# Patient Record
Sex: Male | Born: 1960 | Race: White | Hispanic: No | Marital: Married | Smoking: Current every day smoker
Health system: Southern US, Community
[De-identification: ages and names within clinical notes are randomized; demographics above are authoritative.]

## PROBLEM LIST (undated history)

## (undated) DIAGNOSIS — I10 Essential (primary) hypertension: Secondary | ICD-10-CM

## (undated) DIAGNOSIS — I2510 Atherosclerotic heart disease of native coronary artery without angina pectoris: Secondary | ICD-10-CM

## (undated) DIAGNOSIS — I4891 Unspecified atrial fibrillation: Secondary | ICD-10-CM

## (undated) HISTORY — PX: KNEE SURGERY: SHX244

## (undated) HISTORY — PX: LEFT HEART CATH: CATH118248

---

## 2018-11-13 ENCOUNTER — Observation Stay (HOSPITAL_COMMUNITY)
Admission: EM | Admit: 2018-11-13 | Discharge: 2018-11-14 | Discharge status: Against Medical Advice | Payer: Self-pay | Attending: Internal Medicine | Admitting: Internal Medicine

## 2018-11-13 ENCOUNTER — Encounter (HOSPITAL_COMMUNITY): Payer: Self-pay | Admitting: Internal Medicine

## 2018-11-13 ENCOUNTER — Emergency Department (HOSPITAL_COMMUNITY): Payer: Self-pay

## 2018-11-13 ENCOUNTER — Other Ambulatory Visit: Payer: Self-pay

## 2018-11-13 DIAGNOSIS — R401 Stupor: Secondary | ICD-10-CM

## 2018-11-13 DIAGNOSIS — T50901A Poisoning by unspecified drugs, medicaments and biological substances, accidental (unintentional), initial encounter: Secondary | ICD-10-CM

## 2018-11-13 DIAGNOSIS — T5191XA Toxic effect of unspecified alcohol, accidental (unintentional), initial encounter: Secondary | ICD-10-CM | POA: Insufficient documentation

## 2018-11-13 DIAGNOSIS — Z1159 Encounter for screening for other viral diseases: Secondary | ICD-10-CM | POA: Insufficient documentation

## 2018-11-13 DIAGNOSIS — I2510 Atherosclerotic heart disease of native coronary artery without angina pectoris: Secondary | ICD-10-CM | POA: Insufficient documentation

## 2018-11-13 DIAGNOSIS — Y906 Blood alcohol level of 120-199 mg/100 ml: Secondary | ICD-10-CM | POA: Insufficient documentation

## 2018-11-13 DIAGNOSIS — T424X1A Poisoning by benzodiazepines, accidental (unintentional), initial encounter: Principal | ICD-10-CM | POA: Insufficient documentation

## 2018-11-13 DIAGNOSIS — F329 Major depressive disorder, single episode, unspecified: Secondary | ICD-10-CM | POA: Insufficient documentation

## 2018-11-13 DIAGNOSIS — Z7901 Long term (current) use of anticoagulants: Secondary | ICD-10-CM | POA: Insufficient documentation

## 2018-11-13 DIAGNOSIS — I480 Paroxysmal atrial fibrillation: Secondary | ICD-10-CM

## 2018-11-13 DIAGNOSIS — I4891 Unspecified atrial fibrillation: Secondary | ICD-10-CM | POA: Diagnosis present

## 2018-11-13 DIAGNOSIS — F17210 Nicotine dependence, cigarettes, uncomplicated: Secondary | ICD-10-CM | POA: Insufficient documentation

## 2018-11-13 DIAGNOSIS — I10 Essential (primary) hypertension: Secondary | ICD-10-CM | POA: Diagnosis present

## 2018-11-13 DIAGNOSIS — T40604A Poisoning by unspecified narcotics, undetermined, initial encounter: Secondary | ICD-10-CM | POA: Diagnosis present

## 2018-11-13 HISTORY — DX: Atherosclerotic heart disease of native coronary artery without angina pectoris: I25.10

## 2018-11-13 HISTORY — DX: Unspecified atrial fibrillation: I48.91

## 2018-11-13 HISTORY — DX: Essential (primary) hypertension: I10

## 2018-11-13 LAB — PROTIME-INR
INR: 0.9 (ref 0.8–1.2)
Prothrombin Time: 12.5 seconds (ref 11.4–15.2)

## 2018-11-13 LAB — COMPREHENSIVE METABOLIC PANEL
ALT: 14 U/L (ref 0–44)
AST: 25 U/L (ref 15–41)
Albumin: 3.5 g/dL (ref 3.5–5.0)
Alkaline Phosphatase: 48 U/L (ref 38–126)
Anion gap: 10 (ref 5–15)
BUN: 16 mg/dL (ref 6–20)
CO2: 26 mmol/L (ref 22–32)
Calcium: 8.8 mg/dL — ABNORMAL LOW (ref 8.9–10.3)
Chloride: 104 mmol/L (ref 98–111)
Creatinine, Ser: 0.85 mg/dL (ref 0.61–1.24)
GFR calc Af Amer: >60 mL/min (ref >60)
GFR calc non Af Amer: >60 mL/min (ref >60)
Glucose, Bld: 96 mg/dL (ref 70–99)
Potassium: 3.2 mmol/L — ABNORMAL LOW (ref 3.5–5.1)
Sodium: 140 mmol/L (ref 135–145)
Total Bilirubin: 0.6 mg/dL (ref 0.3–1.2)
Total Protein: 6.2 g/dL — ABNORMAL LOW (ref 6.5–8.1)

## 2018-11-13 LAB — CBC WITH DIFFERENTIAL/PLATELET
Abs Immature Granulocytes: 0.03 10*3/uL (ref 0.00–0.07)
Basophils Absolute: 0.1 10*3/uL (ref 0.0–0.1)
Basophils Relative: 1 %
Eosinophils Absolute: 0.3 10*3/uL (ref 0.0–0.5)
Eosinophils Relative: 4 %
HCT: 39.6 % (ref 39.0–52.0)
Hemoglobin: 13.2 g/dL (ref 13.0–17.0)
Immature Granulocytes: 1 %
Lymphocytes Relative: 31 %
Lymphs Abs: 2 10*3/uL (ref 0.7–4.0)
MCH: 32.4 pg (ref 26.0–34.0)
MCHC: 33.3 g/dL (ref 30.0–36.0)
MCV: 97.3 fL (ref 80.0–100.0)
Monocytes Absolute: 0.7 10*3/uL (ref 0.1–1.0)
Monocytes Relative: 11 %
Neutro Abs: 3.4 10*3/uL (ref 1.7–7.7)
Neutrophils Relative %: 52 %
Platelets: 241 10*3/uL (ref 150–400)
RBC: 4.07 MIL/uL — ABNORMAL LOW (ref 4.22–5.81)
RDW: 13.2 % (ref 11.5–15.5)
WBC: 6.5 10*3/uL (ref 4.0–10.5)
nRBC: 0 % (ref 0.0–0.2)

## 2018-11-13 LAB — RAPID URINE DRUG SCREEN, HOSP PERFORMED
Amphetamines: NOT DETECTED
Barbiturates: NOT DETECTED
Benzodiazepines: POSITIVE — AB
Cocaine: NOT DETECTED
Opiates: NOT DETECTED
Tetrahydrocannabinol: NOT DETECTED

## 2018-11-13 LAB — SALICYLATE LEVEL: Salicylate Lvl: <7 mg/dL (ref 2.8–30.0)

## 2018-11-13 LAB — ACETAMINOPHEN LEVEL: Acetaminophen (Tylenol), Serum: <10 ug/mL — ABNORMAL LOW (ref 10–30)

## 2018-11-13 LAB — URINALYSIS, ROUTINE W REFLEX MICROSCOPIC
Bilirubin Urine: NEGATIVE
Glucose, UA: NEGATIVE mg/dL
Hgb urine dipstick: NEGATIVE
Ketones, ur: NEGATIVE mg/dL
Leukocytes,Ua: NEGATIVE
Nitrite: NEGATIVE
Protein, ur: NEGATIVE mg/dL
Specific Gravity, Urine: 1.018 (ref 1.005–1.030)
pH: 5 (ref 5.0–8.0)

## 2018-11-13 LAB — ETHANOL: Alcohol, Ethyl (B): 148 mg/dL — ABNORMAL HIGH (ref <10)

## 2018-11-13 LAB — SARS CORONAVIRUS 2: SARS Coronavirus 2: NOT DETECTED

## 2018-11-13 MED ORDER — ONDANSETRON HCL 4 MG PO TABS: 4.0000 mg | ORAL_TABLET | Freq: Four times a day (QID) | ORAL | Status: DC | PRN

## 2018-11-13 MED ORDER — NALOXONE HCL 2 MG/2ML IJ SOSY
1.0000 mg | PREFILLED_SYRINGE | Freq: Once | INTRAMUSCULAR | Status: DC
  Filled 2018-11-13: qty 2

## 2018-11-13 MED ORDER — NALOXONE HCL 2 MG/2ML IJ SOSY
2.0000 mg | PREFILLED_SYRINGE | Freq: Once | INTRAMUSCULAR | Status: AC
  Administered 2018-11-13: 2 mg via INTRAVENOUS

## 2018-11-13 MED ORDER — ACETAMINOPHEN 325 MG PO TABS: 650.0000 mg | ORAL_TABLET | Freq: Four times a day (QID) | ORAL | Status: DC | PRN

## 2018-11-13 MED ORDER — ACETAMINOPHEN 650 MG RE SUPP: 650.0000 mg | Freq: Four times a day (QID) | RECTAL | Status: DC | PRN

## 2018-11-13 MED ORDER — NALOXONE HCL 4 MG/10ML IJ SOLN
3.0000 mg/h | INTRAVENOUS | Status: DC
  Administered 2018-11-13: 22:00:00 3 mg/h via INTRAVENOUS
  Filled 2018-11-13 (×2): qty 10

## 2018-11-13 MED ORDER — NALOXONE HCL 2 MG/2ML IJ SOSY
2.0000 mg | PREFILLED_SYRINGE | Freq: Once | INTRAMUSCULAR | Status: AC
  Administered 2018-11-13: 2 mg via INTRAVENOUS
  Filled 2018-11-13: qty 2

## 2018-11-13 MED ORDER — ONDANSETRON HCL 4 MG/2ML IJ SOLN: 4.0000 mg | Freq: Four times a day (QID) | INTRAMUSCULAR | Status: DC | PRN

## 2018-11-13 MED ORDER — POTASSIUM CHLORIDE 10 MEQ/100ML IV SOLN
10.0000 meq | Freq: Once | INTRAVENOUS | Status: AC
  Administered 2018-11-14: 10 meq via INTRAVENOUS
  Filled 2018-11-13: qty 100

## 2018-11-13 MED ORDER — NICOTINE 21 MG/24HR TD PT24: 21.0000 mg | MEDICATED_PATCH | Freq: Every day | TRANSDERMAL | Status: DC

## 2018-11-13 MED ORDER — NALOXONE HCL 0.4 MG/ML IJ SOLN
0.4000 mg | INTRAMUSCULAR | Status: DC | PRN
  Filled 2018-11-13: qty 5

## 2018-11-13 MED ORDER — ENOXAPARIN SODIUM 40 MG/0.4ML ~~LOC~~ SOLN: 40.0000 mg | SUBCUTANEOUS | Status: DC

## 2018-11-13 MED ORDER — THIAMINE HCL 100 MG/ML IJ SOLN
Freq: Once | INTRAVENOUS | Status: AC
  Administered 2018-11-14: via INTRAVENOUS
  Filled 2018-11-13: qty 1000

## 2018-11-13 NOTE — Consult Note (Signed)
NAME:  Francetta FoundRichard Maslanka, MRN:  [id number], DOB:  12-29-1960, LOS: 0 ADMISSION DATE:  11/13/2018, CONSULTATION DATE:  11/13/2018 REFERRING MD:  Dr. Fransisca KaufmannPfizer, CHIEF COMPLAINT:  Acute encephalopathy  History of present illness   Mr. Wyn ForsterHildebrandt is a 58 year old gentleman with history significant for A. fib on anticoagulation with Xarelto, hypertension and depression and tobacco abuse who we are evaluating for possible ICU admission.  History is obtained from the patient.  Patient presented to the ED with altered mental status in the setting of alcohol use and unknown ingestion.  Patient states that he was feeling stressed today.  He looks through his medications.  He states that he had Xanax from a previous prescription.  He states that he has not been on Xanax for a long time.  But today he took several pills of Xanax.  He was also drinking quite a bit of alcohol.  He also states that he typically does not drink alone he just drinks occasionally.  Per chart review he was found to have altered mental status and almost unresponsive by his wife.  He also had an episode of emesis.  He was brought to the ED.  In the ED he was given a dose of Narcan with some improvement to his mental status.  He was then put on a drip.  On my evaluation he is completely awake.  He is talking coherently and able to provide good history.  PC CM consulted to evaluate for admission to the ICU.  Past Medical History  -Atrial fibrillation on Xarelto -Depression -Tobacco abuse: Smokes and 1/2 packs a day.  He has been smoking for the past 46 years.   Consults:  -PCCM  Procedures:  -None   Significant Diagnostic Tests:  -CXR-Normal -CT head-No acute intracranial abnormalities   Micro Data:  -SARS-CoV-2- Negative   Antimicrobials:  -None    Objective   Blood pressure 116/77, pulse (!) 58, temperature 98.2 F (36.8 C), temperature source Oral, resp. rate 17, SpO2 98 %.       No intake or output data in the 24  hours ending 11/13/18 2335 There were no vitals filed for this visit.  Examination: General: initially somnolent but woke up and remained awake and coherent HENT: Normocephalic Lungs: CTAB Cardiovascular: Normal heart sounds  Abdomen: Soft, non tender, no organomegaly Extremities: Normal, no deformities Neuro: no focal neurological deficits, awake and alert, oriented to time and person and place,  GU: normal    Assessment & Plan:  Mr. Wyn ForsterHildebrandt is a 58 year old gentleman with history significant for A. fib on anticoagulation with Xarelto, hypertension and depression and tobacco abuse who we are evaluating for possible ICU admission due to acute toxic metabolic encephalopathy due to alcohol and benzodiazepine ingestion.  He is now more awake and communicating appropriately. He currently does not have any ICU needs thus does not warrant ICU admission.  Thank you for this consult and for allowing us to participate in the care of your patient. PCCM will sign off at this time. Do not hesitate to contact us if any new needs arise or with any new pertinent information.  Labs   CBC: Recent Labs  Lab 11/13/18 1841  WBC 6.5  NEUTROABS 3.4  HGB 13.2  HCT 39.6  MCV 97.3  PLT 241    Basic Metabolic Panel: Recent Labs  Lab 11/13/18 1841  NA 140  K 3.2*  CL 104  CO2 26  GLUCOSE 96  BUN 16  CREATININE 0.85  CALCIUM 8.8*  GFR: CrCl cannot be calculated (Unknown ideal weight.). Recent Labs  Lab 11/13/18 1841  WBC 6.5    Liver Function Tests: Recent Labs  Lab 11/13/18 1841  AST 25  ALT 14  ALKPHOS 48  BILITOT 0.6  PROT 6.2*  ALBUMIN 3.5   No results for input(s): LIPASE, AMYLASE in the last 168 hours. No results for input(s): AMMONIA in the last 168 hours.  ABG No results found for: PHART, PCO2ART, PO2ART, HCO3, TCO2, ACIDBASEDEF, O2SAT   Coagulation Profile: Recent Labs  Lab 11/13/18 1841  INR 0.9    Cardiac Enzymes: No results for input(s): CKTOTAL,  CKMB, CKMBINDEX, TROPONINI in the last 168 hours.  HbA1C: No results found for: HGBA1C  CBG: No results for input(s): GLUCAP in the last 168 hours.  Review of Systems:   Review of Systems  Constitutional: Negative for chills and fever.  HENT: Negative for congestion.   Eyes: Negative for blurred vision.  Respiratory: Negative for cough and sputum production.   Cardiovascular: Negative for chest pain and leg swelling.  Gastrointestinal: Negative for nausea.  Genitourinary: Negative for dysuria.  Musculoskeletal: Negative for neck pain.  Neurological: Negative for headaches.  Endo/Heme/Allergies: Does not bruise/bleed easily.    Past Medical History  He,  has no past medical history on file.   Surgical History   The histories are not reviewed yet. Please review them in the "History" navigator section and refresh this [place].   Social History      Family History   His family history is not on file.   Allergies Not on File   Home Medications  Prior to Admission medications   Not on File     Consult time: 11 Minutes   Oswald Hillock, MD PCCM

## 2018-11-13 NOTE — ED Provider Notes (Signed)
[facility] Provider Note   CSN: [id number] Arrival date & time: 11/13/18  1831     History   Chief Complaint Chief Complaint  Patient presents with  . Altered Mental Status    HPI Alexander Carson is a 58 y.o. male.     HPI History is obtained both from the patient's wife via telephone and EMS.  Patient's wife reports that he had a normal day.  He had breakfast and lunch without any issues.  He did not seem to be ill or having any kind of problems.  He reports he did drink a couple of 4 Loco which she thinks is what caused the problem.  He went to get something at the store which was about 20 minutes later she reports from when she had seen him normal.  Reports when he got back and came out of the car he was staggering and walking sideways.  She reports he was eating a bunch of peanut M&M's and slurring his speech.  She reports that he would stop eating the M&Ms and then he sat down and got kind of poorly responsive and vomited all over himself.  Was at that point that she called EMS.  EMS reports on their arrival the patient was eating in a chair with vomit on him.  He never spoke to them in a meaningful fashion.  He is vital signs were stable.  He did not have any hypertension or hypotension.  His oxygen saturations were normal on room air.  He did not have any flaccid paralysis.  He was transported in such a condition.  EMS reports that during transport they got a phone call that the patient might have gone over to someone else's house and taken some OxyContin pills.  I am not quite sure how that history got obtained.  Patient is wife absolutely denies that the patient would have had any other drug ingestions.  She is convinced the problem was the 4 loco in combination with some of his heart history.  She reports that he has been "Paddled 2 times in the past month" to fix his heart rhythm (she does not know the name of the heart rhythm problem) at  all treatment was in [place].  She reports that he takes Zoloft daily, a blood pressure pill that she does not know the name of, and an "crazy pill" every once in a while.  That pill apparently is for depression and something he takes more on a as needed basis.  She does report they have been under a lot of stress lately they just recently moved to the area.  She reports that they had multiple friends or family member who died of COVID in [place] and so they left the area to start over. No past medical history on file.  There are no active problems to display for this patient.         Home Medications    Prior to Admission medications   Not on File    Family History No family history on file.  Social History Social History   Tobacco Use  . Smoking status: Not on file  Substance Use Topics  . Alcohol use: Not on file  . Drug use: Not on file     Allergies   Patient has no allergy information on record.   Review of Systems Review of Systems All 5 caveat cannot obtain review of systems due to patient condition  Physical Exam Updated Vital Signs BP 99/73   Pulse 81   Temp 98.2 F (36.8 C) (Oral)   Resp (!) 27   SpO2 94%   Physical Exam Constitutional:      Comments: On arrival, patient's color is good.  Respirations are regular.  The does not open his eyes spontaneously  but stays asleep throughout the exam.  HENT:     Head: Normocephalic and atraumatic.     Nose: Nose normal.     Mouth/Throat:     Mouth: Mucous membranes are moist.     Pharynx: Oropharynx is clear.  Eyes:     Comments: Pupils are approximately 2 to 3 mm and responsive.  Eye movements are conjugate.  Patient has corneal response  Neck:     Musculoskeletal: Neck supple.  Cardiovascular:     Rate and Rhythm: Normal rate and regular rhythm.     Pulses: Normal pulses.     Heart sounds: Normal heart sounds.  Pulmonary:     Comments: No increased work of breathing.  Breath sounds are grossly  clear. Abdominal:     General: There is no distension.     Palpations: Abdomen is soft.     Tenderness: There is no abdominal tenderness. There is no guarding.  Musculoskeletal:     Comments: Extremities are normal appearance.  They are well developed.  No peripheral edema.  Skin condition is good.  No joint effusions.  No signs of trauma.  Skin:    General: Skin is warm and dry.  Neurological:     Comments: Patient is obtunded.  He does not spontaneously open his eyes.  To painful stimulus he will raise both extremities toward the stimulus.  He will also spontaneously move both lower extremities.  There is no indication of a flaccid paralysis.  He has not speaking until given Narcan.  After Narcan he did say several words but often they were difficult to understand.  After Narcan when he had awakened slightly and was trying to go back to sleep I shook his bed and he made a complete sentence to tell me to stop shaking the bed with an expletive.      ED Treatments / Results  Labs (all labs ordered are listed, but only abnormal results are displayed) Labs Reviewed  COMPREHENSIVE METABOLIC PANEL - Abnormal; Notable for the following components:      Result Value   Potassium 3.2 (*)    Calcium 8.8 (*)    Total Protein 6.2 (*)    All other components within normal limits  ETHANOL - Abnormal; Notable for the following components:   Alcohol, Ethyl (B) 148 (*)    All other components within normal limits  CBC WITH DIFFERENTIAL/PLATELET - Abnormal; Notable for the following components:   RBC 4.07 (*)    All other components within normal limits  PROTIME-INR  URINALYSIS, ROUTINE W REFLEX MICROSCOPIC  RAPID URINE DRUG SCREEN, HOSP PERFORMED    EKG EKG Interpretation  Date/Time:  Saturday November 13 2018 18:33:07 EDT Ventricular Rate:  87 PR Interval:    QRS Duration: 111 QT Interval:  399 QTC Calculation: 480 R Axis:   -14 Text Interpretation:  Sinus rhythm Abnormal R-wave  progression, early transition Borderline prolonged QT interval no STEMI. NO OLD COMPARISON Confirmed by Charlesetta Shanks [phone]) on 11/13/2018 8:07:48 PM   Radiology Ct Head Wo Contrast  Result Date: 11/13/2018 CLINICAL DATA:  Altered level of consciousness. EXAM: CT HEAD WITHOUT CONTRAST TECHNIQUE: Contiguous axial  images were obtained from the base of the skull through the vertex without intravenous contrast. COMPARISON:  None. FINDINGS: Brain: No evidence of acute infarction, hemorrhage, hydrocephalus, extra-axial collection or mass lesion/mass effect. Vascular: No hyperdense vessel or unexpected calcification. Skull: Normal. Negative for fracture or focal lesion. Sinuses/Orbits: No acute finding. There is poor dentition with multiple dental caries. Other: None. IMPRESSION: No acute intracranial abnormality. Electronically Signed   By: Katherine Mantlehristopher  Green M.D.   On: 11/13/2018 19:02   Dg Chest Port 1 View  Result Date: 11/13/2018 CLINICAL DATA:  Mental status changes EXAM: PORTABLE CHEST 1 VIEW COMPARISON:  None. FINDINGS: Heart and mediastinal contours are within normal limits. No focal opacities or effusions. No acute bony abnormality. IMPRESSION: No active disease. Electronically Signed   By: Charlett NoseKevin  Dover M.D.   On: 11/13/2018 19:10    Procedures Procedures (including critical care time) CRITICAL CARE Performed by: Arby BarretteMarcy Jevan Gaunt   Total critical care time: 60 minutes  Critical care time was exclusive of separately billable procedures and treating other patients.  Critical care was necessary to treat or prevent imminent or life-threatening deterioration.  Critical care was time spent personally by me on the following activities: development of treatment plan with patient and/or surrogate as well as nursing, discussions with consultants, evaluation of patient's response to treatment, examination of patient, obtaining history from patient or surrogate, ordering and performing treatments and  interventions, ordering and review of laboratory studies, ordering and review of radiographic studies, pulse oximetry and re-evaluation of patient's condition. Medications Ordered in ED Medications  naloxone [facility](NARCAN) injection 2 mg (2 mg Intravenous Given 11/13/18 1950)  naloxone [facility](NARCAN) injection 2 mg (2 mg Intravenous Given 11/13/18 1956)     Initial Impression / Assessment and Plan / ED Course  I have reviewed the triage vital signs and the nursing notes.  Pertinent labs & imaging results that were available during my care of the patient were reviewed by me and considered in my medical decision making (see chart for details).  Clinical Course as of Nov 12 2305  Sat Nov 13, 2018  2152 Consult: Dr. Otelia LimesLindzen neurology.  Agrees with proceeding with Narcan drip and MRI.  At this time, with no focal deficit and some response to Narcan will await formal consult for MRI results and response to Narcan.  Call with any changes.   [MP]  2306 Consult: Reviewed with Dr. Detterding intensivist.  We will have the patient evaluated the emergency department.   [MP]    Clinical Course User Index [MP] Arby BarrettePfeiffer, Fergus Throne, MD      Throughout the patient stay his heart rhythm has been normal sinus in the 70s.  Pressures have been stable and normal.  With deep sleep there are some sonorous respirations and occasionally the patient has had desaturation to 89%.  His chest x-ray is clear but I do have concern for possible aspiration.  He had been eating a lot of M&M peanuts just before becoming unresponsive and vomiting.  His airway has been clear without any recurrence of emesis here in the emergency department.  He has however has sonorous respirations.  When given Narcan he awakened somewhat and made comments that were difficult to understand but seem to be expressions of irritation of being awakened and examined.  At one point, he did make a full sentence instructing me to stop shaking his bed finished with a  expletive.  Because the only time the patient was awake and verbally responsive was after Narcan, I did  order Narcan drip.  Have also consulted neurology.  Patient's wife seems convinced that narcotics are not involved however with the patient having improved level of verbal responsiveness I did feel is appropriate to initiate.  Plan will also get MRI per consultation with neurology.  Continue to observe.  He has maintained his oxygen saturation and vital signs.  Will be evaluated by intensivist to determine if he is appropriate for ICU admission.   Final Clinical Impressions(s) / ED Diagnoses   Final diagnoses:  Stupor  Accidental drug overdose, initial encounter    ED Discharge Orders    None       Arby BarrettePfeiffer, Colton Engdahl, MD 11/20/18 1259

## 2018-11-13 NOTE — ED Triage Notes (Signed)
Patients family members called ems for c/o patient being altered ; according to family members they came back to the house and found him slumped over in chair and not responding ; family reports the patient drinking a 4 loko and a tall boy today with possible ingestion of oxycodone prior to drinking alcohol ; pt responding only to pain ; ems vitals 110/70 hr 0 cbg 124 and rr 15 with 90% sats on RA

## 2018-11-13 NOTE — ED Notes (Signed)
After 2mg  of Narcan pt is slightly verbal. Pt is AAO to self and place. Disoriented to time and situation. Will continue to monitor.

## 2018-11-14 ENCOUNTER — Encounter (HOSPITAL_COMMUNITY): Payer: Self-pay | Admitting: Internal Medicine

## 2018-11-14 DIAGNOSIS — I10 Essential (primary) hypertension: Secondary | ICD-10-CM

## 2018-11-14 MED ORDER — RIVAROXABAN 20 MG PO TABS
20.0000 mg | ORAL_TABLET | Freq: Every day | ORAL | Status: DC
  Filled 2018-11-14: qty 1

## 2018-11-14 NOTE — Progress Notes (Signed)
Pt is alert and oriented, ambulatory with a steady gait.  Insists on leaving AMA.    MD notified.

## 2018-11-14 NOTE — Progress Notes (Signed)
ANTICOAGULATION CONSULT NOTE - Initial Consult  Pharmacy Consult for xarelto Indication: atrial fibrillation  Not on File  Patient Measurements:    Vital Signs: Temp: 98.2 F (36.8 C) (06/13 1835) Temp Source: Oral (06/13 1835) BP: 116/77 (06/13 2300) Pulse Rate: 58 (06/13 2300)  Labs: Recent Labs    11/13/18 1841  HGB 13.2  HCT 39.6  PLT 241  LABPROT 12.5  INR 0.9  CREATININE 0.85    CrCl cannot be calculated (Unknown ideal weight.).   Medical History: Past Medical History:  Diagnosis Date  . A-fib ([place])   . CAD (coronary artery disease)   . HTN (hypertension)     Medications:  Med history pending  Assessment: 58 yo man to continue xarelto for afib.  He did not take dose today.  Per RN he has been taking every other day as he was about to run out. Goal of Therapy:  theraapeutic anticoagulation Monitor platelets by anticoagulation protocol: Yes   Plan:  Xarelto 20 mg po daily with dinner. Monitor for bleeding complications  Excell Seltzer Poteet 11/14/2018,12:00 AM

## 2018-11-14 NOTE — Discharge Summary (Signed)
58 yo M presenting with suspected OD. See H&P for full details. Patient left AMA prior to being examined by me. Per nursing he was A&O x 3 w/ a steady gait. Recommend he follow up with PCP in next few days.    HPI per H&P Alexander Carson is a 58 y.o. male with medical history significant of A.Fib on Xarelto, HTN, CAD (couldn't get a stent in).  Patient brought in by EMS to ED with AMS.  Suspected OD on EtOH, admits to taking Xanax as well, patient denies opiates though apparently EMS got history that oxycodone might be involved (not sure from where).   Dx per H&P 1. OD - EtOH + Benzos, ? Opiate 1. UDS pos for benzos, EtOH level 148 2. UDS neg for Opiates and patient denies, though this doesn't exclude fentanyl or methadone. 3. Seemed to improve with narcan 4. Tele monitor 5. Cont pulse ox 6. EtCO2 monitor 7. PRN narcan ordered, if he goes down hill mental status wise again, then will need the PRN narcan and narcan gtt restarted. 8. Though im hoping that doesn't happen as its been 6h since arrival to ED so the Xanax should have worn off by this point. 2. A.Fib - 1. Continue Xarelto, takes once a day, doesn't know dose, but presumably pharm should be able to dose this since theres likely only one dose. 2. Continue "medication that begins with a T" takes once a day (but he doesn't think its tykosin), we we can next speak to wife and figure out med and dosing 3. HTN - 1. Continue Coreg when we can figure out dosing, takes twice a day.  Alexander Finner, DO

## 2018-11-14 NOTE — ED Notes (Signed)
ED TO INPATIENT HANDOFF REPORT  ED Nurse Name and Phone #: [phone] Shawna OrleansMelanie, RN  S Name/Age/Gender Alexander Carson 58 y.o. male Room/Bed: 018C/018C  Code Status   Code Status: Full Code  Home/SNF/Other Home Patient oriented to: self, place and time Is this baseline? Yes   Triage Complete: Triage complete  Chief Complaint ALTERED OD  Triage Note Patients family members called ems for c/o patient being altered ; according to family members they came back to the house and found him slumped over in chair and not responding ; family reports the patient drinking a 4 loko and a tall boy today with possible ingestion of oxycodone prior to drinking alcohol ; pt responding only to pain ; ems vitals 110/70 hr 0 cbg 124 and rr 15 with 90% sats on RA   Allergies Not on File  Level of Care/Admitting Diagnosis ED Disposition    ED Disposition Condition Comment   Admit  [facility] [[id number]]  Level of Care: Progressive [102]  I expect the patient will be discharged within 24 hours: Yes  LOW acuity---Tx typically complete <24 hrs---ACUTE conditions typically can be evaluated <24 hours---LABS likely to return to acceptable levels <24 hours---IS near functional baseline---EXPECTED to return to current living arrangement---NOT newly hypoxic: Does not meet criteria for 5C-Observation unit  Covid Evaluation: Screening Protocol (No Symptoms)  Diagnosis: Overdose of opiate or related narcotic, undetermined intent, initial encounter [facility](HCC) [[id number]]) [[id number]]  Admitting Physician: Hillary BowGARDNER, JARED M [phone][[id number]]  Attending Physician: Hillary BowGARDNER, JARED M [[id number]]  PT Class (Do Not Modify): Observation [104]  PT Acc Code (Do Not Modify): Observation [10022]       B Medical/Surgery History Past Medical History:  Diagnosis Date  . A-fib (HCC)   . CAD (coronary artery disease)   . HTN (hypertension)       A IV Location/Drains/Wounds Patient Lines/Drains/Airways Status   Active  Line/Drains/Airways    Name:   Placement date:   Placement time:   Site:   Days:   Peripheral IV 11/13/18 Left Forearm   11/13/18    -    Forearm   1          Intake/Output Last 24 hours No intake or output data in the 24 hours ending 11/14/18 0000  Labs/Imaging Results for orders placed or performed during the hospital encounter of 11/13/18 (from the past 48 hour(s))  Urine rapid drug screen (hosp performed)     Status: Abnormal   Collection Time: 11/13/18  6:34 PM  Result Value Ref Range   Opiates NONE DETECTED NONE DETECTED   Cocaine NONE DETECTED NONE DETECTED   Benzodiazepines POSITIVE (A) NONE DETECTED   Amphetamines NONE DETECTED NONE DETECTED   Tetrahydrocannabinol NONE DETECTED NONE DETECTED   Barbiturates NONE DETECTED NONE DETECTED    Comment: (NOTE) DRUG SCREEN FOR MEDICAL PURPOSES ONLY.  IF CONFIRMATION IS NEEDED FOR ANY PURPOSE, NOTIFY LAB WITHIN 5 DAYS. LOWEST DETECTABLE LIMITS FOR URINE DRUG SCREEN Drug Class                     Cutoff (ng/mL) Amphetamine and metabolites    1000 Barbiturate and metabolites    200 Benzodiazepine                 200 Tricyclics and metabolites     300 Opiates and metabolites        300 Cocaine and metabolites        300 THC  50 Performed at [facility], [address] [place], [place] [address]   Comprehensive metabolic panel     Status: Abnormal   Collection Time: 11/13/18  6:41 PM  Result Value Ref Range   Sodium 140 135 - 145 mmol/L   Potassium 3.2 (L) 3.5 - 5.1 mmol/L   Chloride 104 98 - 111 mmol/L   CO2 26 22 - 32 mmol/L   Glucose, Bld 96 70 - 99 mg/dL   BUN 16 6 - 20 mg/dL   Creatinine, Ser 6.040.85 0.61 - 1.24 mg/dL   Calcium 8.8 (L) 8.9 - 10.3 mg/dL   Total Protein 6.2 (L) 6.5 - 8.1 g/dL   Albumin 3.5 3.5 - 5.0 g/dL   AST 25 15 - 41 U/L   ALT 14 0 - 44 U/L   Alkaline Phosphatase 48 38 - 126 U/L   Total Bilirubin 0.6 0.3 - 1.2 mg/dL   GFR calc non Af Amer >60 >60 mL/min   GFR  calc Af Amer >60 >60 mL/min   Anion gap 10 5 - 15    Comment: Performed at [facility], [address] [place], [place] [address]  Ethanol     Status: Abnormal   Collection Time: 11/13/18  6:41 PM  Result Value Ref Range   Alcohol, Ethyl (B) 148 (H) <10 mg/dL    Comment: (NOTE) Lowest detectable limit for serum alcohol is 10 mg/dL. For medical purposes only. Performed at [facility], [address] [place], [place] [address]   CBC with Differential     Status: Abnormal   Collection Time: 11/13/18  6:41 PM  Result Value Ref Range   WBC 6.5 4.0 - 10.5 K/uL   RBC 4.07 (L) 4.22 - 5.81 MIL/uL   Hemoglobin 13.2 13.0 - 17.0 g/dL   HCT 78.239.6 95.639.0 - 21.352.0 %   MCV 97.3 80.0 - 100.0 fL   MCH 32.4 26.0 - 34.0 pg   MCHC 33.3 30.0 - 36.0 g/dL   RDW 08.613.2 57.811.5 - 46.915.5 %   Platelets 241 150 - 400 K/uL   nRBC 0.0 0.0 - 0.2 %   Neutrophils Relative % 52 %   Neutro Abs 3.4 1.7 - 7.7 K/uL   Lymphocytes Relative 31 %   Lymphs Abs 2.0 0.7 - 4.0 K/uL   Monocytes Relative 11 %   Monocytes Absolute 0.7 0.1 - 1.0 K/uL   Eosinophils Relative 4 %   Eosinophils Absolute 0.3 0.0 - 0.5 K/uL   Basophils Relative 1 %   Basophils Absolute 0.1 0.0 - 0.1 K/uL   Immature Granulocytes 1 %   Abs Immature Granulocytes 0.03 0.00 - 0.07 K/uL    Comment: Performed at [facility], [address] [place], [place] [address]  Protime-INR     Status: None   Collection Time: 11/13/18  6:41 PM  Result Value Ref Range   Prothrombin Time 12.5 11.4 - 15.2 seconds   INR 0.9 0.8 - 1.2    Comment: (NOTE) INR goal varies based on device and disease states. Performed at [facility], [address] [place], [place] [address]   Acetaminophen level     Status: Abnormal   Collection Time: 11/13/18  8:41 PM  Result Value Ref Range   Acetaminophen (Tylenol), Serum <10 (L) 10 - 30 ug/mL    Comment: (NOTE) Therapeutic concentrations vary significantly. A range of 10-30 ug/mL  may be an effective  concentration for many patients.  However, some  are best treated at concentrations outside of this range. Acetaminophen concentrations >150 ug/mL at 4 hours after ingestion  and >50 ug/mL at 12 hours after ingestion are often associated with  toxic reactions. Performed at [facility], [place] [address], [place], [place] [address]   Salicylate level     Status: None   Collection Time: 11/13/18  8:41 PM  Result Value Ref Range   Salicylate Lvl <8.6 2.8 - 30.0 mg/dL    Comment: Performed at [facility] [address], [place], [place] [address]  SARS Coronavirus 2     Status: None   Collection Time: 11/13/18  8:41 PM  Result Value Ref Range   SARS Coronavirus 2 NOT DETECTED NOT DETECTED    Comment: (NOTE) SARS-CoV-2 target nucleic acids are NOT DETECTED. The SARS-CoV-2 RNA is generally detectable in upper and lower respiratory specimens during the acute phase of infection.  Negative  results do not preclude SARS-CoV-2 infection, do not rule out co-infections with other pathogens, and should not be used as the sole basis for treatment or other patient management decisions.  Negative results must be combined with clinical observations, patient history, and epidemiological information. The expected result is Not Detected. Fact Sheet for Patients: http://www.biofiredefense.com/wp-content/uploads/2020/03/BIOFIRE-COVID -19-patients.pdf Fact Sheet for Healthcare Providers: http://www.biofiredefense.com/wp-content/uploads/2020/03/BIOFIRE-COVID -19-hcp.pdf This test is not yet approved or cleared by the [place] and  has been authorized for detection and/or diagnosis of SARS-CoV-2 by FDA under an Emergency Use Authorization (EUA).  This EUA will remain in effec t (meaning this test can be used) for the duration of  the COVID-19 declaration under Section 564(b)(1) of the Act, 21 U.S.C. section 360bbb-3(b)(1), unless the authorization is terminated or revoked  sooner. Performed at [facility], [place] [address], [place], [place] [address]   Urinalysis, Routine w reflex microscopic     Status: None   Collection Time: 11/13/18 10:01 PM  Result Value Ref Range   Color, Urine YELLOW YELLOW   APPearance CLEAR CLEAR   Specific Gravity, Urine 1.018 1.005 - 1.030   pH 5.0 5.0 - 8.0   Glucose, UA NEGATIVE NEGATIVE mg/dL   Hgb urine dipstick NEGATIVE NEGATIVE   Bilirubin Urine NEGATIVE NEGATIVE   Ketones, ur NEGATIVE NEGATIVE mg/dL   Protein, ur NEGATIVE NEGATIVE mg/dL   Nitrite NEGATIVE NEGATIVE   Leukocytes,Ua NEGATIVE NEGATIVE    Comment: Performed at [facility] [address] [place], [place] [address]   Ct Head Wo Contrast  Result Date: 11/13/2018 CLINICAL DATA:  Altered level of consciousness. EXAM: CT HEAD WITHOUT CONTRAST TECHNIQUE: Contiguous axial images were obtained from the base of the skull through the vertex without intravenous contrast. COMPARISON:  None. FINDINGS: Brain: No evidence of acute infarction, hemorrhage, hydrocephalus, extra-axial collection or mass lesion/mass effect. Vascular: No hyperdense vessel or unexpected calcification. Skull: Normal. Negative for fracture or focal lesion. Sinuses/Orbits: No acute finding. There is poor dentition with multiple dental caries. Other: None. IMPRESSION: No acute intracranial abnormality. Electronically Signed   By: Constance Holster M.D.   On: 11/13/2018 19:02   Dg Chest Port 1 View  Result Date: 11/13/2018 CLINICAL DATA:  Mental status changes EXAM: PORTABLE CHEST 1 VIEW COMPARISON:  None. FINDINGS: Heart and mediastinal contours are within normal limits. No focal opacities or effusions. No acute bony abnormality. IMPRESSION: No active disease. Electronically Signed   By: Rolm Baptise M.D.   On: 11/13/2018 19:10    Pending Labs [employer] (  From admission, onward)    Start     Ordered   11/13/18 2334  HIV antibody (Routine Testing)  Once,   STAT     11/13/18 2335           Vitals/Pain Today's Vitals   11/13/18 2215 11/13/18 2230 11/13/18 2245 11/13/18 2300  BP: 105/76 109/76 110/82 116/77  Pulse: 64 64 63 (!) 58  Resp: 16 14 15 17   Temp:      TempSrc:      SpO2: 97% 97% 97% 98%    Isolation Precautions No active isolations  Medications Medications  sodium chloride 0.9 % 1,000 mL with thiamine 100 mg, folic acid 1 mg, multivitamins adult 10 mL infusion (has no administration in time range)  potassium chloride 10 mEq in 100 mL IVPB (has no administration in time range)  naloxone (NARCAN) injection 0.4-2 mg (has no administration in time range)  acetaminophen (TYLENOL) tablet 650 mg (has no administration in time range)    Or  acetaminophen (TYLENOL) suppository 650 mg (has no administration in time range)  ondansetron (ZOFRAN) tablet 4 mg (has no administration in time range)    Or  ondansetron (ZOFRAN) injection 4 mg (has no administration in time range)  nicotine (NICODERM CQ - dosed in mg/24 hours) patch 21 mg (has no administration in time range)  naloxone [facility](NARCAN) injection 2 mg (2 mg Intravenous Given 11/13/18 1950)  naloxone (NARCAN) injection 2 mg (2 mg Intravenous Given 11/13/18 1956)    Mobility walks Moderate fall risk   Focused Assessments Neuro Assessment Handoff:  Swallow screen pass? N/A         Neuro Assessment: Exceptions to WDL Neuro Checks:      Last Documented NIHSS Modified Score:   Has TPA been given? No If patient is a Neuro Trauma and patient is going to OR before floor call report to 4N Charge nurse: [phone] or [phone]     R Recommendations: See Admitting Provider Note  Report given to:   Additional Notes:  Pt a lot more awake after narcan drip. Still repeatedly asking questions.

## 2018-11-14 NOTE — H&P (Signed)
History and Physical    Alexander FoundRichard Gregori Carson:[id number]:[id number] DOB: 1961-03-15 DOA: 11/13/2018  PCP: System, Pcp Not In  Patient coming from: Home  I have personally briefly reviewed patient's old medical [facility]  Chief Complaint: OD  HPI: Alexander FoundRichard Carson is a 58 y.o. male with medical history significant of A.Fib on Xarelto, HTN, CAD (couldn't get a stent in).  Patient brought in by EMS to ED with AMS.  Suspected OD on EtOH, admits to taking Xanax as well, patient denies opiates though apparently EMS got history that oxycodone might be involved (not sure from where).   ED Course: Anyhow, patient given 2mg  narcan with some improvement, put on narcan gtt, and now awake, alert and oriented x4.  Denies opiates.  Admits to taking non-prescribed Xanax, admits to EtOH.  PCCM saw patient, didn't feel that the narcan gtt needed to be continued so [facility], now asking hospitalist to admit.   Review of Systems: As per HPI otherwise 10 point review of systems negative.   Past Medical History:  Diagnosis Date  . A-fib (HCC)   . CAD (coronary artery disease)   . HTN (hypertension)     Past Surgical History:  Procedure Laterality Date  . KNEE SURGERY    . LEFT HEART CATH       reports that he has been smoking. He does not have any smokeless tobacco history on file. He reports current alcohol use. He reports current drug use. Drug: Benzodiazepines.  Not on File  Family History  Problem Relation Age of Onset  . Heart disease Neg Hx      Prior to Admission medications   Not on File    Physical Exam: Vitals:   11/13/18 2315 11/13/18 2330 11/13/18 2345 11/14/18 0000  BP: 113/81 110/78  109/83  Pulse: (!) 58 68 76 73  Resp: 16 (!) 22 14 19   Temp:      TempSrc:      SpO2: 98% 94% 94%     Constitutional: NAD, calm, comfortable Eyes: PERRL, lids and conjunctivae normal ENMT: Mucous membranes are moist. Posterior pharynx clear of any exudate or  lesions.Normal dentition.  Neck: normal, supple, no masses, no thyromegaly Respiratory: clear to auscultation bilaterally, no wheezing, no crackles. Normal respiratory effort. No accessory muscle use.  Cardiovascular: Regular rate and rhythm, no murmurs / rubs / gallops. No extremity edema. 2+ pedal pulses. No carotid bruits.  Abdomen: no tenderness, no masses palpated. No hepatosplenomegaly. Bowel sounds positive.  Musculoskeletal: no clubbing / cyanosis. No joint deformity upper and lower extremities. Good ROM, no contractures. Normal muscle tone.  Skin: no rashes, lesions, ulcers. No induration Neurologic: CN 2-12 grossly intact. Sensation intact, DTR normal. Strength 5/5 in all 4.  Psychiatric: Normal judgment and insight. Alert and oriented x 3. Normal mood. Slightly sleepy but alert, oriented, and awake.   Labs on Admission: I have personally reviewed following labs and imaging studies  CBC: Recent Labs  Lab 11/13/18 1841  WBC 6.5  NEUTROABS 3.4  HGB 13.2  HCT 39.6  MCV 97.3  PLT 241   Basic Metabolic Panel: Recent Labs  Lab 11/13/18 1841  NA 140  K 3.2*  CL 104  CO2 26  GLUCOSE 96  BUN 16  CREATININE 0.85  CALCIUM 8.8*   GFR: CrCl cannot be calculated (Unknown ideal weight.). Liver Function Tests: Recent Labs  Lab 11/13/18 1841  AST 25  ALT 14  ALKPHOS 48  BILITOT 0.6  PROT 6.2*  ALBUMIN 3.5   No results for input(s): LIPASE, AMYLASE in the last 168 hours. No results for input(s): AMMONIA in the last 168 hours. Coagulation Profile: Recent Labs  Lab 11/13/18 1841  INR 0.9   Cardiac Enzymes: No results for input(s): CKTOTAL, CKMB, CKMBINDEX, TROPONINI in the last 168 hours. BNP (last 3 results) No results for input(s): PROBNP in the last 8760 hours. HbA1C: No results for input(s): HGBA1C in the last 72 hours. CBG: No results for input(s): GLUCAP in the last 168 hours. Lipid Profile: No results for input(s): CHOL, HDL, LDLCALC, TRIG, CHOLHDL,  LDLDIRECT in the last 72 hours. Thyroid Function Tests: No results for input(s): TSH, T4TOTAL, FREET4, T3FREE, THYROIDAB in the last 72 hours. Anemia Panel: No results for input(s): VITAMINB12, FOLATE, FERRITIN, TIBC, IRON, RETICCTPCT in the last 72 hours. Urine analysis:    Component Value Date/Time   COLORURINE YELLOW 11/13/2018 2201   APPEARANCEUR CLEAR 11/13/2018 2201   LABSPEC 1.018 11/13/2018 2201   PHURINE 5.0 11/13/2018 2201   GLUCOSEU NEGATIVE 11/13/2018 2201   HGBUR NEGATIVE 11/13/2018 2201   BILIRUBINUR NEGATIVE 11/13/2018 2201   KETONESUR NEGATIVE 11/13/2018 2201   PROTEINUR NEGATIVE 11/13/2018 2201   NITRITE NEGATIVE 11/13/2018 2201   LEUKOCYTESUR NEGATIVE 11/13/2018 2201    Radiological Exams on Admission: Ct Head Wo Contrast  Result Date: 11/13/2018 CLINICAL DATA:  Altered level of consciousness. EXAM: CT HEAD WITHOUT CONTRAST TECHNIQUE: Contiguous axial images were obtained from the base of the skull through the vertex without intravenous contrast. COMPARISON:  None. FINDINGS: Brain: No evidence of acute infarction, hemorrhage, hydrocephalus, extra-axial collection or mass lesion/mass effect. Vascular: No hyperdense vessel or unexpected calcification. Skull: Normal. Negative for fracture or focal lesion. Sinuses/Orbits: No acute finding. There is poor dentition with multiple dental caries. Other: None. IMPRESSION: No acute intracranial abnormality. Electronically Signed   By: Constance Holster M.D.   On: 11/13/2018 19:02   Dg Chest Port 1 View  Result Date: 11/13/2018 CLINICAL DATA:  Mental status changes EXAM: PORTABLE CHEST 1 VIEW COMPARISON:  None. FINDINGS: Heart and mediastinal contours are within normal limits. No focal opacities or effusions. No acute bony abnormality. IMPRESSION: No active disease. Electronically Signed   By: Rolm Baptise M.D.   On: 11/13/2018 19:10    EKG: Independently reviewed.  Assessment/Plan Principal Problem:   Overdose of opiate or  related narcotic, undetermined intent, initial encounter ([place]) Active Problems:   A-fib ([place])   HTN (hypertension)    1. OD - EtOH + Benzos, ? Opiate 1. UDS pos for benzos, EtOH level 148 2. UDS neg for Opiates and patient denies, though this doesn't exclude fentanyl or methadone. 3. Seemed to improve with narcan 4. Tele monitor 5. Cont pulse ox 6. EtCO2 monitor 7. PRN narcan ordered, if he goes down hill mental status wise again, then will need the PRN narcan and narcan gtt restarted. 8. Though im hoping that doesn't happen as its been 6h since arrival to ED so the Xanax should have worn off by this point. 2. A.Fib - 1. Continue Xarelto, takes once a day, doesn't know dose, but presumably pharm should be able to dose this since theres likely only one dose. 2. Continue "medication that begins with a T" takes once a day (but he doesn't think its tykosin), we we can next speak to wife and figure out med and dosing 3. HTN - 1. Continue Coreg when we can figure out dosing, takes twice a day.  DVT prophylaxis: Xarelto Code Status:  Full Family Communication: No family in room Disposition Plan: Home after admit Consults called: None Admission status: Place in 60obs    ,  M. DO [facility]  How to contact the [facility] Attending or Consulting provider 7A - 7P or covering provider during after hours 7P -7A, for this patient?  1. Check the care team in [facility] and look for a) attending/consulting [facility] provider listed and b) the [facility] team listed 2. Log into [email or web address]  Amion Physician Scheduling and messaging for groups and whole hospitals  On call and physician scheduling software for group practices, residents, hospitalists and other medical providers for call, clinic, rotation and shift schedules. On[employer] is a hospital-wide system for scheduling doctors and paging doctors on call. EasyPlot is for scientific plotting and data analysis.  [email or web address]  and use [facility]'s universal password to access. If you do not have the password, please contact the hospital operator.  3. Locate the [facility] provider you are looking for under [facility] and page to a number that you can be directly reached. 4. If you still have difficulty reaching the provider, please page the [facility] (Director on Call) for the Hospitalists listed on amion for assistance.  11/14/2018, 12:03 AM

## 2018-11-14 NOTE — ED Notes (Signed)
RN called pharmacy to ensure compatability

## 2018-11-14 NOTE — Progress Notes (Signed)
Pt came to floor from ER. Vital Signs are stable.  He is alert to self, time, place and situation.  Pt is still very lethargic.  He is falling asleep while we are talking and explaining things to him.  Hooked patient up to telemetry.

## 2018-12-21 ENCOUNTER — Other Ambulatory Visit: Payer: Self-pay

## 2018-12-21 ENCOUNTER — Encounter (HOSPITAL_COMMUNITY): Payer: Self-pay | Admitting: Emergency Medicine

## 2018-12-21 ENCOUNTER — Emergency Department (HOSPITAL_COMMUNITY)
Admission: EM | Admit: 2018-12-21 | Discharge: 2018-12-21 | Disposition: A | Discharge status: Home / Self Care | Payer: Self-pay | Attending: Emergency Medicine | Admitting: Emergency Medicine

## 2018-12-21 DIAGNOSIS — Z79899 Other long term (current) drug therapy: Secondary | ICD-10-CM | POA: Insufficient documentation

## 2018-12-21 DIAGNOSIS — F17200 Nicotine dependence, unspecified, uncomplicated: Secondary | ICD-10-CM | POA: Insufficient documentation

## 2018-12-21 DIAGNOSIS — I10 Essential (primary) hypertension: Secondary | ICD-10-CM | POA: Insufficient documentation

## 2018-12-21 DIAGNOSIS — K047 Periapical abscess without sinus: Secondary | ICD-10-CM | POA: Insufficient documentation

## 2018-12-21 DIAGNOSIS — I2510 Atherosclerotic heart disease of native coronary artery without angina pectoris: Secondary | ICD-10-CM | POA: Insufficient documentation

## 2018-12-21 MED ORDER — BUPIVACAINE-EPINEPHRINE (PF) 0.5% -1:200000 IJ SOLN
1.8000 mL | Freq: Once | INTRAMUSCULAR | Status: AC
  Administered 2018-12-21: 1.8 mL

## 2018-12-21 MED ORDER — CLINDAMYCIN HCL 150 MG PO CAPS: 450.0000 mg | ORAL_CAPSULE | Freq: Three times a day (TID) | ORAL | 0 refills | Status: AC

## 2018-12-21 MED ORDER — CLINDAMYCIN HCL 150 MG PO CAPS
450.0000 mg | ORAL_CAPSULE | Freq: Once | ORAL | Status: AC
  Administered 2018-12-21: 450 mg via ORAL
  Filled 2018-12-21: qty 3

## 2018-12-21 MED FILL — CLINDAMYCIN HCL 150 MG CAPS: 150 | 7 days supply | Qty: 60 | Fill #0

## 2018-12-21 NOTE — Discharge Instructions (Signed)
Please see the information and instructions below regarding your visit.  Your diagnoses today include:  1. Dental abscess   2. Encounter for medication management    You have a dental infection. It is very important that you get evaluated by a dentist as soon as possible. Call tomorrow to schedule an appointment. Tylenol as needed for pain. Take your full course of antibiotics. Read the instructions below.  Tests performed today include: See side panel of your discharge paperwork for testing performed today. Vital signs are listed at the bottom of these instructions.   Medications prescribed:    Take any prescribed medications only as prescribed, and any over the counter medications only as directed on the packaging.  1. You are prescribed Clindamycin, an antibiotic. Please take all of your antibiotics until finished.   You may develop abdominal discomfort or nausea from the antibiotic. If this occurs, you may take it with food. Some patients also get diarrhea with antibiotics. You may help offset this with probiotics which you can buy or get in yogurt. Do not eat or take the probiotics until 2 hours after your antibiotic.   Some people develop allergies to antibiotics. Symptoms of antibiotic allergy can be mild and include a flat rash and itching. They can also be more serious and include:  ?Hives - Hives are raised, red patches of skin that are usually very itchy.  ?Lip or tongue swelling  ?Trouble swallowing or breathing  ?Blistering of the skin or mouth.  If you have any of these serious symptoms, please seek emergency medical care immediately.  2.  You may take Tylenol, 650 mg every 6 hours as needed for pain.  Do not exceed 4000 mg in 1 day.  Home care instructions:  Please follow any educational materials contained in this packet.   Eat a soft or liquid diet and rinse your mouth out after meals with warm water. You should see a dentist or return here at once if you have  increased swelling, increased pain or uncontrolled bleeding from the site of your injury.  Please perform warm salt water rinses several times a day.  This will promote drainage from the tooth.  Follow-up instructions: It is very important that you see a dentist as soon as possible. There is a list of dentists attached to this packet if you do not have care established with a dentist already. Please give a call to a dentist of your choice tomorrow.  Return instructions:  Please return to the Emergency Department if you experience worsening symptoms.  Please seek care if you note any of the following about your dental pain:  You have increased pain not controlled with medicines.  You have swelling around your tooth, in your face or neck.  You have bleeding which starts, continues, or gets worse.  You have a fever >101 If you are unable to open your mouth Please return if you have any other emergent concerns.  Additional Information:   Your vital signs today were: BP (!) 139/100 (BP Location: Right Arm)    Pulse 82    Temp 98.1 F (36.7 C) (Oral)    Resp 18    Ht 6' (1.829 m)    Wt 77.1 kg    SpO2 98%    BMI 23.06 kg/m If your blood pressure (BP) was elevated on multiple readings during this visit above 130 for the top number or above 80 for the bottom number, please have this repeated by your primary care  provider within one month. --------------  Thank you for allowing [place] to participate in your care today.

## 2018-12-21 NOTE — ED Provider Notes (Signed)
[place] EMERGENCY DEPARTMENT Provider Note   CSN: [id number] Arrival date & time: 12/21/18  1021     History   Chief Complaint Chief Complaint  Patient presents with  . Dental Pain    HPI Alexander Carson is a 58 y.o. male.     HPI  Patient is a 58 year old male with past medical history of atrial fibrillation, CAD, hypertension, overdose on polysubstance use, alcohol use presenting for dental pain and possible dental abscess.  Patient reports that he has had ongoing dental pain due to poor dentition on the left side of his mouth however this morning he woke up with some swelling on the left side of his face and pain along the gumline.  He denies fevers or chills.  Denies difficulty breathing, difficulty swallowing, neck pain or submandibular tenderness.  He does not have current dental care as he recently moved from [place].  At a secondary issue, patient reports that he is out of all of his home medications.  He was discharged from [facility] about a month ago for an overdose and medications were not prescribed.  He is reportedly on Xarelto, sotalol, carvedilol, and sertraline.  He has been without these medications approximately 1 month.  Denies any known atrial fibrillation episodes.  Past Medical History:  Diagnosis Date  . A-fib ([place])   . CAD (coronary artery disease)   . HTN (hypertension)     Patient Active Problem List   Diagnosis Date Noted  . Overdose of opiate or related narcotic, undetermined intent, initial encounter ([place]) 11/13/2018  . A-fib ([place]) 11/13/2018  . HTN (hypertension) 11/13/2018    Past Surgical History:  Procedure Laterality Date  . KNEE SURGERY    . LEFT HEART CATH          Home Medications    Prior to Admission medications   Not on File    Family History Family History  Problem Relation Age of Onset  . Heart disease Neg Hx     Social History Social History   Tobacco Use  . Smoking  status: Current Every Day Smoker  Substance Use Topics  . Alcohol use: Yes  . Drug use: Yes    Types: Benzodiazepines    Comment: Denies opiates     Allergies   Patient has no known allergies.   Review of Systems Review of Systems  Constitutional: Negative for chills and fever.  HENT: Positive for dental problem and facial swelling. Negative for trouble swallowing and voice change.   Respiratory: Negative for stridor.     Physical Exam Updated Vital Signs BP (!) 139/100 (BP Location: Right Arm)   Pulse 82   Temp 98.1 F (36.7 C) (Oral)   Resp 18   Ht 6' (1.829 m)   Wt 77.1 kg   SpO2 98%   BMI 23.06 kg/m   Physical Exam Vitals signs and nursing note reviewed.  Constitutional:      General: He is not in acute distress.    Appearance: He is well-developed. He is not diaphoretic.     Comments: Sitting comfortably in bed.  HENT:     Head: Normocephalic and atraumatic.     Mouth/Throat:      Comments: Mild left facial swelling lateral to left upper molars. Dental cavities and poor oral dentition noted. Pain along tooth as depicted in image with associate abscess. No abscess noted. Midline uvula. No trismus. OP clear and moist. No oropharyngeal erythema or edema. Neck  supple with no tenderness.  Eyes:     General:        Right eye: No discharge.        Left eye: No discharge.     Conjunctiva/sclera: Conjunctivae normal.     Comments: EOMs normal to gross examination.  Neck:     Musculoskeletal: Normal range of motion.  Cardiovascular:     Rate and Rhythm: Normal rate and regular rhythm.     Pulses: Normal pulses.  Pulmonary:     Effort: Pulmonary effort is normal.     Breath sounds: Normal breath sounds. No wheezing or rales.     Comments: Converses comfortably. No audible wheeze or stridor.  Abdominal:     General: There is no distension.  Musculoskeletal: Normal range of motion.  Skin:    General: Skin is warm and dry.  Neurological:     Mental Status: He  is alert.     Comments: Cranial nerves intact to gross observation. Patient moves extremities without difficulty.  Psychiatric:        Behavior: Behavior normal.        Thought Content: Thought content normal.        Judgment: Judgment normal.      ED Treatments / Results  Labs (all labs ordered are listed, but only abnormal results are displayed) Labs Reviewed - No data to display  EKG None  Radiology No results found.  Procedures .Marland Kitchen.Incision and Drainage  Date/Time: 12/21/2018 1:38 PM Performed by: Elisha PonderMurray, Ieisha Gao B, PA-C Authorized by: Elisha PonderMurray, Nubia Ziesmer B, PA-C   Consent:    Consent obtained:  Verbal   Consent given by:  Patient   Risks discussed:  Bleeding, incomplete drainage, pain and damage to other organs   Alternatives discussed:  No treatment Universal protocol:    Procedure explained and questions answered to patient or proxy's satisfaction: yes     Relevant documents present and verified: yes     Test results available and properly labeled: yes     Imaging studies available: yes     Required blood products, implants, devices, and special equipment available: yes     Site/side marked: yes     Immediately prior to procedure a time out was called: yes     Patient identity confirmed:  Verbally with patient Location:    Type:  Abscess   Location: Dental. Anesthesia (see MAR for exact dosages):    Anesthesia method:  Local infiltration   Local anesthetic:  Bupivacaine 0.5% WITH epi Procedure type:    Complexity:  Simple Procedure details:    Incision types:  Single straight   Scalpel blade:  11   Wound management:  Probed and deloculated, irrigated with saline and extensive cleaning   Drainage:  Purulent   Drainage amount:  Moderate  Dental Block  Date/Time: 12/21/2018 1:41 PM Performed by: Elisha PonderMurray, Orvin Netter B, PA-C Authorized by: Elisha PonderMurray, Dena Esperanza B, PA-C   Consent:    Consent obtained:  Verbal   Consent given by:  Patient   Risks discussed:  Pain,  swelling and unsuccessful block   Alternatives discussed:  No treatment Indications:    Indications: dental abscess   Location:    Block type:  Supraperiosteal   Supraperiosteal location:  Upper teeth   Upper teeth location:  12/LU 1st bicuspid and 13/LU 2nd bicuspid Procedure details (see MAR for exact dosages):    Topical anesthetic:  Benzocaine gel   Syringe type:  Aspirating dental syringe   Needle gauge:  25  G   Anesthetic injected:  Bupivacaine 0.5% WITH epi   Injection procedure:  Anatomic landmarks identified, introduced needle, incremental injection, negative aspiration for blood and anatomic landmarks palpated Post-procedure details:    Outcome:  Anesthesia achieved   Patient tolerance of procedure:  Tolerated well, no immediate complications   (including critical care time)  Medications Ordered in ED Medications  bupivacaine-epinephrine (MARCAINE W/ EPI) 0.5% -1:200000 injection 1.8 mL (1.8 mLs Infiltration Given 12/21/18 1253)     Initial Impression / Assessment and Plan / ED Course  I have reviewed the triage vital signs and the nursing notes.  Pertinent labs & imaging results that were available during my care of the patient were reviewed by me and considered in my medical decision making (see chart for details).       This is a well-appearing 58 year old male with past medical history of atrial fibrillation, CAD, polysubstance use and alcohol use presenting for dental pain, possible abscess and mild facial swelling.  Dental abscess drained without difficulty.  Patient is tolerating p.o. with intact airway.  No evidence of deep space infection of the head or neck.  Will start patient on clindamycin.  Will provide recommendations for dental follow-up.  Case discussed with attending physician, Dr. Caryn BeeKevin spinal, and due to patient's ongoing heavy alcohol use, polysubstance use with overdose requiring admission to the hospital 1 month ago, feel that potentially the risks  of prescribing Xarelto without known close follow-up outweigh the benefits.  Additionally, patient has stable blood pressure, he has not been in atrial fibrillation either on previous admission or today, and is not appearing to be symptomatic or having paroxysmal atrial fibrillation, feel that this can wait for outpatient management.  Patient is on some high risk medications.  Patient arranged for case management follow-up at [facility] as well as cardiology.  This is a supervised visit with Dr. Cathren LaineKevin Steinl. Evaluation, management, and discharge planning discussed with this attending physician.  Final Clinical Impressions(s) / ED Diagnoses   Final diagnoses:  Dental abscess  Encounter for medication management    ED Discharge Orders         Ordered    Ambulatory referral to Cardiology    Comments: History of Afib and CAD. New to area.   12/21/18 1258    clindamycin (CLEOCIN) 150 MG capsule  3 times daily     12/21/18 1346           Delia ChimesMurray, Winston Misner B, PA-C 12/21/18 1349    Cathren LaineSteinl, Kevin, MD 12/21/18 [phone]

## 2018-12-21 NOTE — ED Triage Notes (Addendum)
Pt arrives to ED from home with complaints of a tooth abcess that has been causing him pain since Sunday. Pt states that he woke up this morning and the side of his face is now swollen. Pt also states he would like his medication refilled for his Afib and CAD.

## 2018-12-21 NOTE — Progress Notes (Signed)
TOC CM contacted pt to give information about his appt with [facility] on 01/14/2019 at 63 am. Explained Cardiologist office will call him with appt. If he does not receive call, he can call office to follow up. States he has a [employer], [place] from [place]. Jonnie Finner RN CCM Case Mgmt phone [phone]

## 2018-12-21 NOTE — ED Triage Notes (Signed)
Pt asking for refills on home meds.

## 2018-12-21 NOTE — ED Notes (Signed)
Patient verbalizes understanding of discharge instructions . Opportunity for questions and answers were provided . Armband removed by staff ,Pt discharged from ED. W/C  offered at D/C  and Declined W/C at D/C and was escorted to lobby by RN.  

## 2019-01-14 ENCOUNTER — Other Ambulatory Visit: Payer: Self-pay

## 2019-01-14 ENCOUNTER — Ambulatory Visit: Payer: Self-pay | Attending: Nurse Practitioner | Admitting: Nurse Practitioner

## 2021-02-19 IMAGING — CR PORTABLE CHEST - 1 VIEW
1 series · 1 of 1 positions shown · non-contrast
Comparison: None.

CLINICAL DATA: Mental status changes

EXAM:
PORTABLE CHEST 1 VIEW

[AP]
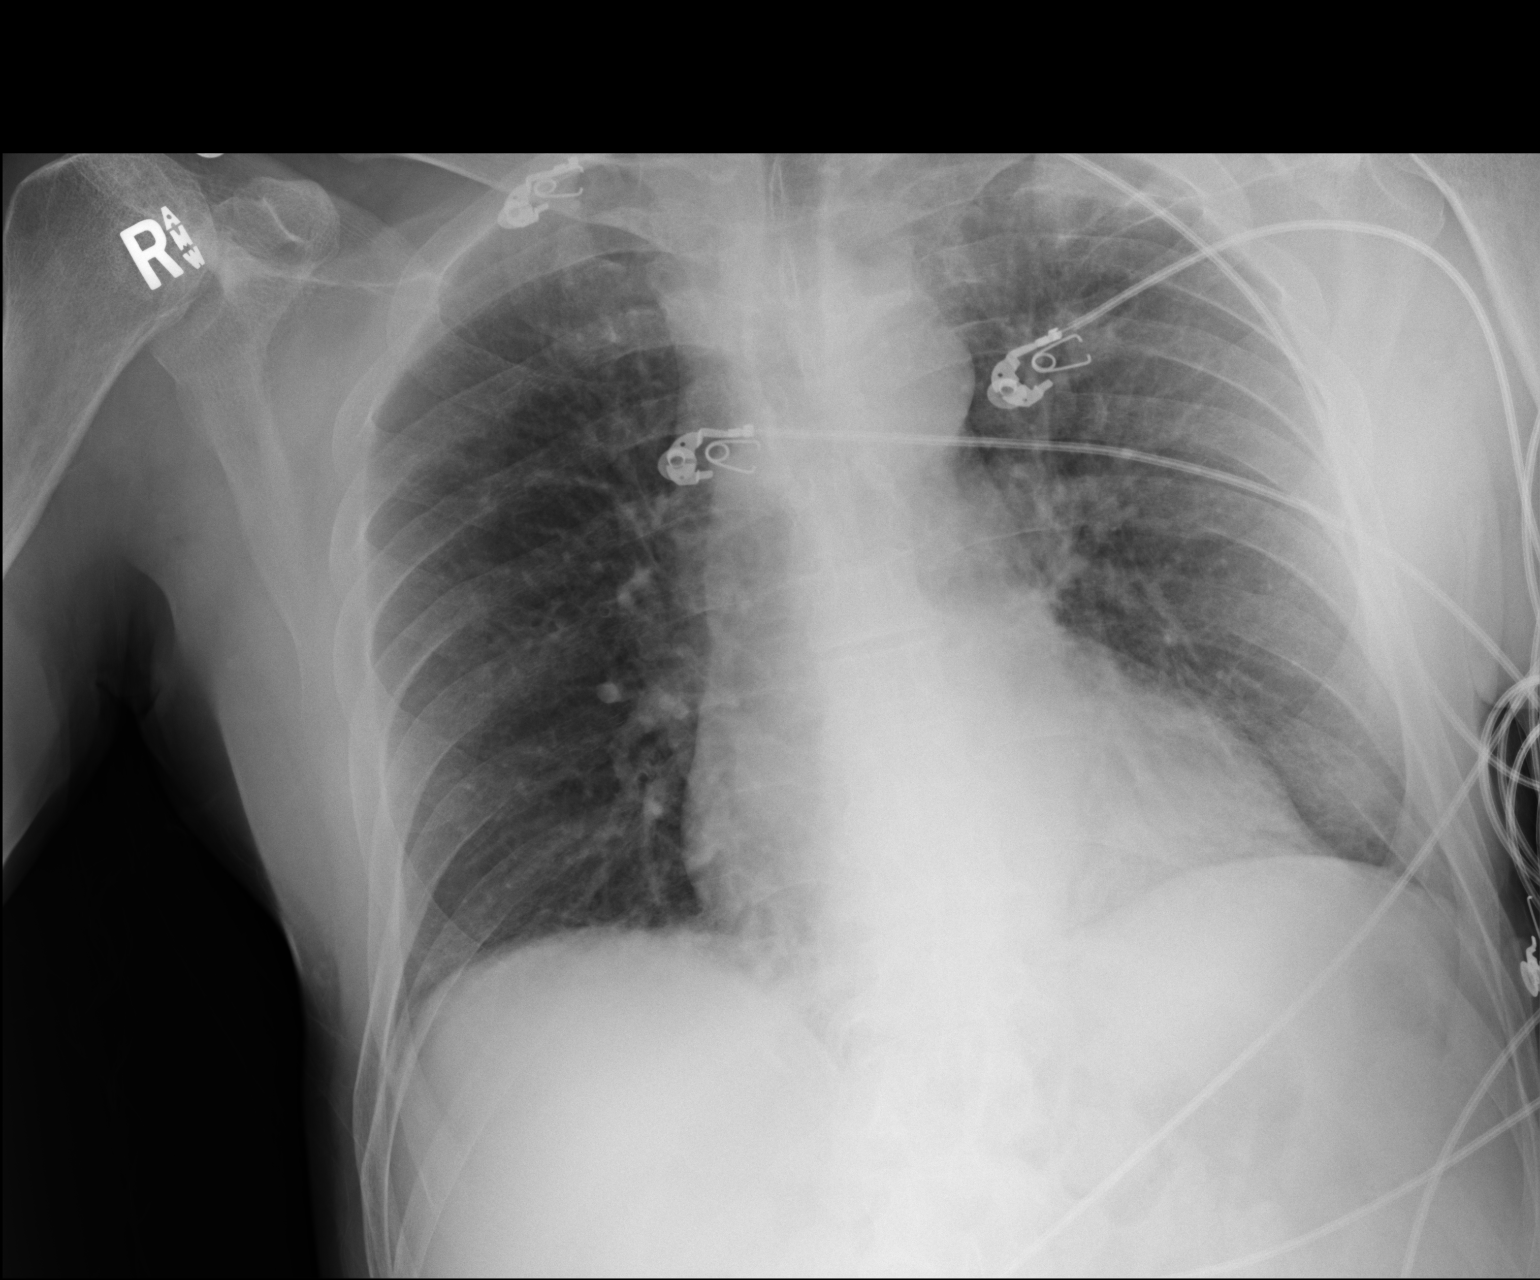

[1 of 1 positions shown; findings below may reference images not displayed]

FINDINGS: Heart and mediastinal contours are within normal limits. No focal
opacities or effusions. No acute bony abnormality.
IMPRESSION: No active disease.
